# Patient Record
Sex: Female | Born: 1960 | Race: Black or African American | Hispanic: No | Marital: Single | Smoking: Never smoker
Health system: Southern US, Community
[De-identification: ages and names within clinical notes are randomized; demographics above are authoritative.]

## PROBLEM LIST (undated history)

## (undated) DIAGNOSIS — F329 Major depressive disorder, single episode, unspecified: Secondary | ICD-10-CM

## (undated) DIAGNOSIS — F32A Depression, unspecified: Secondary | ICD-10-CM

## (undated) DIAGNOSIS — M797 Fibromyalgia: Secondary | ICD-10-CM

## (undated) DIAGNOSIS — G4700 Insomnia, unspecified: Secondary | ICD-10-CM

## (undated) DIAGNOSIS — K2970 Gastritis, unspecified, without bleeding: Secondary | ICD-10-CM

## (undated) HISTORY — PX: CERVICAL SPINE SURGERY: SHX589

## (undated) HISTORY — PX: BACK SURGERY: SHX140

## (undated) HISTORY — PX: ABDOMINAL HYSTERECTOMY: SHX81

---

## 2012-12-05 ENCOUNTER — Emergency Department (HOSPITAL_COMMUNITY): Payer: Medicare Other

## 2012-12-05 ENCOUNTER — Encounter (HOSPITAL_COMMUNITY): Payer: Self-pay | Admitting: *Deleted

## 2012-12-05 ENCOUNTER — Inpatient Hospital Stay (HOSPITAL_COMMUNITY)
Admission: EM | Admit: 2012-12-05 | Discharge: 2012-12-07 | DRG: 101 | Disposition: A | Discharge status: Home / Self Care | Payer: Medicare Other | Attending: Family Medicine | Admitting: Family Medicine

## 2012-12-05 DIAGNOSIS — F329 Major depressive disorder, single episode, unspecified: Secondary | ICD-10-CM | POA: Diagnosis present

## 2012-12-05 DIAGNOSIS — R51 Headache: Secondary | ICD-10-CM | POA: Diagnosis present

## 2012-12-05 DIAGNOSIS — N189 Chronic kidney disease, unspecified: Secondary | ICD-10-CM | POA: Diagnosis present

## 2012-12-05 DIAGNOSIS — R519 Headache, unspecified: Secondary | ICD-10-CM

## 2012-12-05 DIAGNOSIS — F3289 Other specified depressive episodes: Secondary | ICD-10-CM | POA: Diagnosis present

## 2012-12-05 DIAGNOSIS — G40A01 Absence epileptic syndrome, not intractable, with status epilepticus: Principal | ICD-10-CM | POA: Diagnosis present

## 2012-12-05 DIAGNOSIS — R5381 Other malaise: Secondary | ICD-10-CM | POA: Diagnosis present

## 2012-12-05 DIAGNOSIS — R4182 Altered mental status, unspecified: Secondary | ICD-10-CM | POA: Diagnosis present

## 2012-12-05 LAB — BASIC METABOLIC PANEL
BUN: 11 mg/dL (ref 6–23)
CO2: 27 mEq/L (ref 19–32)
Chloride: 104 mEq/L (ref 96–112)
Creatinine, Ser: 1.37 mg/dL — ABNORMAL HIGH (ref 0.50–1.10)
Glucose, Bld: 107 mg/dL — ABNORMAL HIGH (ref 70–99)
Potassium: 4.1 mEq/L (ref 3.5–5.1)

## 2012-12-05 LAB — CBC WITH DIFFERENTIAL/PLATELET
Basophils Relative: 0 % (ref 0–1)
HCT: 39.4 % (ref 36.0–46.0)
Hemoglobin: 12.9 g/dL (ref 12.0–15.0)
Lymphocytes Relative: 38 % (ref 12–46)
Lymphs Abs: 2 10*3/uL (ref 0.7–4.0)
MCHC: 32.7 g/dL (ref 30.0–36.0)
Monocytes Absolute: 0.3 10*3/uL (ref 0.1–1.0)
Monocytes Relative: 7 % (ref 3–12)
Neutro Abs: 2.9 10*3/uL (ref 1.7–7.7)
RBC: 4.54 MIL/uL (ref 3.87–5.11)

## 2012-12-05 MED ORDER — IBUPROFEN 800 MG PO TABS
800.0000 mg | ORAL_TABLET | Freq: Once | ORAL | Status: AC
  Administered 2012-12-05: 800 mg via ORAL
  Filled 2012-12-05: qty 1

## 2012-12-05 MED ORDER — SODIUM CHLORIDE 0.9 % IJ SOLN
3.0000 mL | Freq: Two times a day (BID) | INTRAMUSCULAR | Status: DC
  Administered 2012-12-06 – 2012-12-07 (×3): 3 mL via INTRAVENOUS

## 2012-12-05 MED ORDER — SODIUM CHLORIDE 0.9 % IV SOLN
INTRAVENOUS | Status: AC
  Administered 2012-12-06: via INTRAVENOUS

## 2012-12-05 MED ORDER — HEPARIN SODIUM (PORCINE) 5000 UNIT/ML IJ SOLN
5000.0000 [IU] | Freq: Three times a day (TID) | INTRAMUSCULAR | Status: DC
  Administered 2012-12-06: 5000 [IU] via SUBCUTANEOUS
  Filled 2012-12-05 (×6): qty 1

## 2012-12-05 MED ORDER — ACETAMINOPHEN 500 MG PO TABS
1000.0000 mg | ORAL_TABLET | Freq: Once | ORAL | Status: AC
  Administered 2012-12-05: 1000 mg via ORAL
  Filled 2012-12-05: qty 2

## 2012-12-05 MED ORDER — TRAMADOL HCL 50 MG PO TABS: 50.0000 mg | ORAL_TABLET | Freq: Four times a day (QID) | ORAL | Status: DC | PRN

## 2012-12-05 MED ORDER — ONDANSETRON 8 MG PO TBDP
8.0000 mg | ORAL_TABLET | Freq: Once | ORAL | Status: AC
  Administered 2012-12-05: 8 mg via ORAL
  Filled 2012-12-05: qty 1

## 2012-12-05 MED ORDER — CLONAZEPAM 0.5 MG PO TABS: 0.5000 mg | ORAL_TABLET | Freq: Three times a day (TID) | ORAL | Status: DC | PRN

## 2012-12-05 MED ORDER — PROMETHAZINE HCL 25 MG PO TABS: 25.0000 mg | ORAL_TABLET | Freq: Four times a day (QID) | ORAL | Status: DC | PRN

## 2012-12-05 MED ORDER — VITAMIN B-12 1000 MCG PO TABS
1000.0000 ug | ORAL_TABLET | Freq: Every day | ORAL | Status: DC
  Administered 2012-12-06 – 2012-12-07 (×2): 1000 ug via ORAL
  Filled 2012-12-05 (×2): qty 1

## 2012-12-05 MED ORDER — DULOXETINE HCL 30 MG PO CPEP
30.0000 mg | ORAL_CAPSULE | Freq: Every day | ORAL | Status: DC
  Administered 2012-12-06 – 2012-12-07 (×2): 30 mg via ORAL
  Filled 2012-12-05 (×2): qty 1

## 2012-12-05 MED ORDER — BUPROPION HCL ER (XL) 300 MG PO TB24
300.0000 mg | ORAL_TABLET | Freq: Every day | ORAL | Status: DC
  Administered 2012-12-06 – 2012-12-07 (×2): 300 mg via ORAL
  Filled 2012-12-05 (×2): qty 1

## 2012-12-05 NOTE — Consult Note (Signed)
Reason for Consult: Recurrent spells of transient inattentiveness.  HPI:                                                                                                                                          Jaclyn Gonzales is an 52 y.o. female with a history of depression and anxiety who was transferred from [facility] for evaluation of recurrent spells of inattentiveness and apparent speech difficulty. Symptoms reportedly started yesterday. She's been noted to develop a blank stare and become unresponsive to those around her. She is lucid immediately at the end of the spells. No focal seizure activity has been described. Patient apparently is unaware that she is not responding. CT scan of her head showed no acute intracranial abnormality. She's on Wellbutrin as well as Cymbalta for depression. She indicated she has been taking Wellbutrin for several years. MRI of the brain is pending. EEG is also been ordered. There is no documented history of seizure activity  History reviewed. No pertinent past medical history.  Past Surgical History  Procedure Laterality Date  . Abdominal hysterectomy      History reviewed. No pertinent family history.  Social History:  reports that she has never smoked. She does not have any smokeless tobacco history on file. She reports that she does not drink alcohol or use illicit drugs.  Allergies  Allergen Reactions  . Other     Hard to wake up from anesthesia,     MEDICATIONS:                                                                                                                     I have reviewed the patient's current medications.   ROS:  History obtained from the patient  General ROS: negative for - chills, fatigue, fever, night sweats, weight gain or weight loss Psychological ROS: negative for - behavioral disorder,  hallucinations, memory difficulties, mood swings or suicidal ideation Ophthalmic ROS: negative for - blurry vision, double vision, eye pain or loss of vision ENT ROS: negative for - epistaxis, nasal discharge, oral lesions, sore throat, tinnitus or vertigo Allergy and Immunology ROS: negative for - hives or itchy/watery eyes Hematological and Lymphatic ROS: negative for - bleeding problems, bruising or swollen lymph nodes Endocrine ROS: negative for - galactorrhea, hair pattern changes, polydipsia/polyuria or temperature intolerance Respiratory ROS: negative for - cough, hemoptysis, shortness of breath or wheezing Cardiovascular ROS: negative for - chest pain, dyspnea on exertion, edema or irregular heartbeat Gastrointestinal ROS: negative for - abdominal pain, diarrhea, hematemesis, nausea/vomiting or stool incontinence Genito-Urinary ROS: negative for - dysuria, hematuria, incontinence or urinary frequency/urgency Musculoskeletal ROS: negative for - joint swelling or muscular weakness Neurological ROS: as noted in HPI Dermatological ROS: negative for rash and skin lesion changes   Blood pressure 119/69, pulse 79, temperature 98.3 F (36.8 C), temperature source Oral, resp. rate 16, height 5' 6.5" (1.689 m), weight 61.236 kg (135 lb), SpO2 100.00%.   Neurologic Examination:                                                                                                      Mental Status: Alert, oriented x3 and in no distress.  Speech fluent without evidence of aphasia. Able to follow commands without difficulty. Patient had frequent episodes of developing a blank stare and not responding to verbal stimulation nor to tactile stimulation. Spells typically lasted 20-30 seconds in resolved with patient being immediately lucid again. Spells were occurring with periods of lucidity less than a minute between spells. It was noted that the patient was able to visually track my movements around the room  during the spells. No automatisms were noted. Cranial Nerves: II-Visual fields were normal. III/IV/VI-Pupils were equal and reacted. Extraocular movements were full and conjugate.    V/VII-no facial numbness and no facial weakness. VIII-normal. X-normal speech. Motor: 5/5 bilaterally with normal tone and bulk Sensory: Normal throughout. Deep Tendon Reflexes: 2+ and symmetric. Plantars: Flexor bilaterally Cerebellar: Normal finger-to-nose testing.  No results found for this basename: cbc, bmp, coags, chol, tri, ldl, hga1c    Results for orders placed during the hospital encounter of 12/05/12 (from the past 48 hour(s))  CBC WITH DIFFERENTIAL     Status: None   Collection Time    12/05/12 12:26 PM      Result Value Range   WBC 5.2  4.0 - 10.5 K/uL   RBC 4.54  3.87 - 5.11 MIL/uL   Hemoglobin 12.9  12.0 - 15.0 g/dL   HCT 16.1  09.6 - 04.5 %   MCV 86.8  78.0 - 100.0 fL   MCH 28.4  26.0 - 34.0 pg   MCHC 32.7  30.0 - 36.0 g/dL   RDW 40.9  81.1 - 91.4 %   Platelets 270  150 - 400  K/uL   Neutrophils Relative % 55  43 - 77 %   Neutro Abs 2.9  1.7 - 7.7 K/uL   Lymphocytes Relative 38  12 - 46 %   Lymphs Abs 2.0  0.7 - 4.0 K/uL   Monocytes Relative 7  3 - 12 %   Monocytes Absolute 0.3  0.1 - 1.0 K/uL   Eosinophils Relative 1  0 - 5 %   Eosinophils Absolute 0.0  0.0 - 0.7 K/uL   Basophils Relative 0  0 - 1 %   Basophils Absolute 0.0  0.0 - 0.1 K/uL  BASIC METABOLIC PANEL     Status: Abnormal   Collection Time    12/05/12 12:26 PM      Result Value Range   Sodium 140  135 - 145 mEq/L   Potassium 4.1  3.5 - 5.1 mEq/L   Chloride 104  96 - 112 mEq/L   CO2 27  19 - 32 mEq/L   Glucose, Bld 107 (*) 70 - 99 mg/dL   BUN 11  6 - 23 mg/dL   Creatinine, Ser 1.91 (*) 0.50 - 1.10 mg/dL   Calcium 9.9  8.4 - 47.8 mg/dL   GFR calc non Af Amer 44 (*) >90 mL/min   GFR calc Af Amer 52 (*) >90 mL/min   Comment: (NOTE)     The eGFR has been calculated using the CKD EPI equation.     This  calculation has not been validated in all clinical situations.     eGFR's persistently <90 mL/min signify possible Chronic Kidney     Disease.    Ct Head Wo Contrast  12/05/2012   *RADIOLOGY REPORT*  Clinical Data: Slurred speech and left sided headache.  CT HEAD WITHOUT CONTRAST  Technique:  Contiguous axial images were obtained from the base of the skull through the vertex without contrast.  Comparison: 04/18/2009 from [facility]  Findings: Bone windows demonstrate minimal right-sided mastoid fluid.  Soft tissue windows demonstrate mild motion degradation.  Given this factor, no  mass lesion, hemorrhage, hydrocephalus, acute infarct, intra-axial, or extra-axial fluid collection.  IMPRESSION:  1.  Mildly motion degraded exam.  Given this factor, no acute intracranial findings. 2.  Small right-sided mastoid effusion.   Original Report Authenticated By: Jeronimo Greaves, M.D.    Assessment/Plan: Recurrent spells of inattentiveness with blank stare and unresponsiveness of unclear etiology. It is unclear if these spells are manifestations of an epileptic disorder or possibly psychogenic.  I agree with obtaining MRI study of the brain without and with contrast media, as well as EEG.  I will defer treatment intervention at this point and await results of the above studies.  We'll continue to follow this patient with you.  C.R. Roseanne Reno, MD [facility] [phone]  12/05/2012, 11:47 PM

## 2012-12-05 NOTE — ED Provider Notes (Signed)
CSN: [id number]     Arrival date & time 12/05/12  1057 History  This chart was scribed for Donnetta Hutching, MD by Quintella Reichert, ED scribe.  This patient was seen in room APA10/[facility]10 and the patient's care was started at 12:14 PM.    Chief Complaint  Patient presents with  . Headache    The history is provided by the patient and a relative. No language interpreter was used.   Level 5 Caveat: Difficult to Understand Speech  HPI Comments: Asiana Benninger is a 52 y.o. female with no pertinent medical history who presents to the Emergency Department complaining of sudden-onset speech difficulty that began 4 hours ago with associated headache and weakness.  Pt reports that she woke up yesterday morning with a severe left-sided headache and later that day developed facial numbness and neck pain.  This morning her headache moved to the right side and she developed weakness and fell.  She denies head impact or LOC.  4 hours ago family reports that pt suddenly developed stuttering speech.  They note that at baseline her speech is normal.  Pt states she still has a headache.  She has not taken any pain medications pta.  Pt reports a history of chronic kidney disease.  She denies h/o HTN, DM or any other chronic medical conditions.   History reviewed. No pertinent past medical history.   Past Surgical History  Procedure Laterality Date  . Abdominal hysterectomy      No family history on file.   History  Substance Use Topics  . Smoking status: Never Smoker   . Smokeless tobacco: Not on file  . Alcohol Use: No    OB History   Grav Para Term Preterm Abortions TAB SAB Ect Mult Living                  Review of Systems A complete 10 system review of systems was obtained and all systems are negative except as noted in the HPI and PMH.    Allergies  Other  Home Medications  No current outpatient prescriptions on file.  BP 142/83  Pulse 82  Temp(Src) 98.5 F (36.9 C) (Oral)  Resp  20  Ht 5' 6.5" (1.689 m)  Wt 135 lb (61.236 kg)  BMI 21.47 kg/m2  SpO2 98%  Physical Exam  Nursing note and vitals reviewed. Constitutional: She is oriented to person, place, and time. She appears well-developed and well-nourished.  Speech stuttering  HENT:  Head: Normocephalic and atraumatic.  Eyes: Conjunctivae and EOM are normal. Pupils are equal, round, and reactive to light.  Neck: Normal range of motion. Neck supple.  Cardiovascular: Normal rate, regular rhythm and normal heart sounds.   Pulmonary/Chest: Effort normal and breath sounds normal.  Abdominal: Soft. Bowel sounds are normal.  Musculoskeletal: Normal range of motion.  Neurological: She is alert and oriented to person, place, and time.  Skin: Skin is warm and dry.  Psychiatric: She has a normal mood and affect.    ED Course  Procedures (including critical care time)  DIAGNOSTIC STUDIES: Oxygen Saturation is 98% on room air, normal by my interpretation.    COORDINATION OF CARE: 12:16 PM-Discussed treatment plan which includes head CT with pt at bedside and pt agreed to plan.    Results for orders placed during the hospital encounter of 12/05/12  CBC WITH DIFFERENTIAL      Result Value Range   WBC 5.2  4.0 - 10.5 K/uL   RBC 4.54  3.87 -  5.11 MIL/uL   Hemoglobin 12.9  12.0 - 15.0 g/dL   HCT 95.6  21.3 - 08.6 %   MCV 86.8  78.0 - 100.0 fL   MCH 28.4  26.0 - 34.0 pg   MCHC 32.7  30.0 - 36.0 g/dL   RDW 57.8  46.9 - 62.9 %   Platelets 270  150 - 400 K/uL   Neutrophils Relative % 55  43 - 77 %   Neutro Abs 2.9  1.7 - 7.7 K/uL   Lymphocytes Relative 38  12 - 46 %   Lymphs Abs 2.0  0.7 - 4.0 K/uL   Monocytes Relative 7  3 - 12 %   Monocytes Absolute 0.3  0.1 - 1.0 K/uL   Eosinophils Relative 1  0 - 5 %   Eosinophils Absolute 0.0  0.0 - 0.7 K/uL   Basophils Relative 0  0 - 1 %   Basophils Absolute 0.0  0.0 - 0.1 K/uL  BASIC METABOLIC PANEL      Result Value Range   Sodium 140  135 - 145 mEq/L   Potassium  4.1  3.5 - 5.1 mEq/L   Chloride 104  96 - 112 mEq/L   CO2 27  19 - 32 mEq/L   Glucose, Bld 107 (*) 70 - 99 mg/dL   BUN 11  6 - 23 mg/dL   Creatinine, Ser 5.28 (*) 0.50 - 1.10 mg/dL   Calcium 9.9  8.4 - 41.3 mg/dL   GFR calc non Af Amer 44 (*) >90 mL/min   GFR calc Af Amer 52 (*) >90 mL/min    Ct Head Wo Contrast  12/05/2012   *RADIOLOGY REPORT*  Clinical Data: Slurred speech and left sided headache.  CT HEAD WITHOUT CONTRAST  Technique:  Contiguous axial images were obtained from the base of the skull through the vertex without contrast.  Comparison: 04/18/2009 from [facility]  Findings: Bone windows demonstrate minimal right-sided mastoid fluid.  Soft tissue windows demonstrate mild motion degradation.  Given this factor, no  mass lesion, hemorrhage, hydrocephalus, acute infarct, intra-axial, or extra-axial fluid collection.  IMPRESSION:  1.  Mildly motion degraded exam.  Given this factor, no acute intracranial findings. 2.  Small right-sided mastoid effusion.   Original Report Authenticated By: Jeronimo Greaves, M.D.     MDM  No diagnosis found. Uncertain etiology of patient's presentation. CT scan normal. No neurological deficits at discharge.     I personally performed the services described in this documentation, which was scribed in my presence. The recorded information has been reviewed and is accurate.    Donnetta Hutching, MD 12/05/12 [phone]

## 2012-12-05 NOTE — ED Notes (Signed)
Pt c/o left side headache, neck pain, facial numbness  that was yesterday, right side headache, weakness all over causing her to fall this am, denies hitting her head.  that started this am, pt and family noticed that pt's speech was different around 8:00 today, pt reports that she was stung by a bee also on right wrist a few days ago, admits to nausea,

## 2012-12-05 NOTE — ED Notes (Signed)
Family member called RN to room, states pt " goes into blank stare, like someone flipped a switch for about 10-15 seconds, then she comes back and talks". Pt's son states he has seen this before. Said behavior not witnessed by RN at this time.  EDP notified.

## 2012-12-05 NOTE — ED Notes (Signed)
Called [facility] to give bed assignment.

## 2012-12-05 NOTE — ED Notes (Signed)
EDP at bedside attempting to obtain a more precise history of Pt. Pt and family member state " has spells where she can walk just fine and other times she walks with a cane".  Pt refers to These as spells, but never diagnosed with " anything". Attempted to ambulate pt, Pt out of bed, and " knees buckled" under her,  Pt able to steady self with bed, Pt then attempted to take a step and " knees again buckled".  EDP at bedside witnessed this. Pt returned to bed at this time.

## 2012-12-05 NOTE — ED Notes (Signed)
Pt unable to ambulate to restroom, unsteady gait. Bedside commode provided. 1 assist needed

## 2012-12-05 NOTE — ED Provider Notes (Signed)
Jaclyn Gonzales soon from Dr. Adriana Simas at shift change. Patient is awaiting a call back from the hospitalist at [facility] as the hospitalist here does not feel as though she can receive appropriate care here. Patient had weakness in her legs and speech difficulty off and on throughout the day and the decision was made that she needs an MRI and neurology consultation, both of which we cannot obtain at [facility]. I have spoken with Dr. David Stall at [facility] who agrees to accept the patient in transfer. I have reevaluated the patient she appears neurologically intact at this time. The admitting doctor at [facility] has recommended an EKG be performed and this was done.   Date: 12/05/2012  Rate: 77  Rhythm: normal sinus rhythm  QRS Axis: normal  Intervals: normal  ST/T Wave abnormalities: normal  Conduction Disutrbances:none  Narrative Interpretation:   Old EKG Reviewed: none available    Geoffery Lyons, MD 12/05/12 1723

## 2012-12-05 NOTE — H&P (Signed)
[facility] History and Physical  Jaclyn Gonzales ZOX:[id number] DOB: [birth year] DOA: 12/05/2012  Referring physician: ED PCP: No primary provider on file.   Chief Complaint: Speech difficulty  HPI: Jaclyn Gonzales is a 52 y.o. female no PMH who presented to the ED at [facility] complaining of sudden onset of speech difficulty that began 4 hours ago with associated headache and generalized weakness.  Patient woke up yesterday morning with severe L sided headache, later that day developed facial numbness and neck pain. This morning her headache moved to the right side and she developed weakness and fell. She denies head impact or LOC. 4 hours ago family reports that pt suddenly developed stuttering speech. They note that at baseline her speech is normal. Pt states she still has a headache. She has not taken any pain medications pta. Pt reports a history of chronic kidney disease. She denies h/o HTN, DM or any other chronic medical conditions.  She was seen at [facility] and transferred to [facility] for MRI and neurology consultation.  Review of Systems: Denies any history of seizures, 12 systems reviewed and otherwise negative.  History reviewed. No pertinent past medical history. Past Surgical History  Procedure Laterality Date  . Abdominal hysterectomy     Social History:  reports that she has never smoked. She does not have any smokeless tobacco history on file. She reports that she does not drink alcohol or use illicit drugs.   Allergies  Allergen Reactions  . Other     Hard to wake up from anesthesia,     History reviewed. No pertinent family history.  Prior to Admission medications   Medication Sig Start Date End Date Taking? Authorizing Provider  buPROPion (WELLBUTRIN XL) 300 MG 24 hr tablet Take 1 tablet by mouth daily. 11/14/12  Yes Historical Provider, MD  clonazePAM (KLONOPIN) 0.5 MG tablet Take 1 tablet by mouth 3 (three) times daily as needed. 11/14/12  Yes Historical Provider, MD   DULoxetine (CYMBALTA) 30 MG capsule Take 1 capsule by mouth daily. 11/14/12  Yes Historical Provider, MD  vitamin B-12 (CYANOCOBALAMIN) 1000 MCG tablet Take 1,000 mcg by mouth daily.   Yes Historical Provider, MD  promethazine (PHENERGAN) 25 MG tablet Take 1 tablet (25 mg total) by mouth every 6 (six) hours as needed for nausea. 12/05/12   Donnetta Hutching, MD  traMADol (ULTRAM) 50 MG tablet Take 1 tablet (50 mg total) by mouth every 6 (six) hours as needed for pain. 12/05/12   Donnetta Hutching, MD   Physical Exam: Filed Vitals:   12/05/12 1925  BP: 104/66  Pulse: 87  Temp: 98.1 F (36.7 C)  Resp: 20    General:  NAD, resting comfortably in bed Eyes: PEERLA EOMI ENT: mucous membranes moist Neck: supple w/o JVD Cardiovascular: RRR w/o MRG Respiratory: CTA B Abdomen: soft, nt, nd, bs+ Skin: no rash nor lesion Musculoskeletal: MAE, full ROM all 4 extremities Psychiatric: normal tone and affect Neurologic: AAOx3, the patient is noted to have 6 to 7 episodes of 15-30 seconds of blank staring spells c/w petite mal seizure.  She is unresponsive to noxious and painful stimuli during these spells.  She does not seem to be aware she is having these spells.  Labs on Admission:  Basic Metabolic Panel:  Recent Labs Lab 12/05/12 1226  NA 140  K 4.1  CL 104  CO2 27  GLUCOSE 107*  BUN 11  CREATININE 1.37*  CALCIUM 9.9   Liver Function Tests: No results found for this basename: AST,  ALT, ALKPHOS, BILITOT, PROT, ALBUMIN,  in the last 168 hours No results found for this basename: LIPASE, AMYLASE,  in the last 168 hours No results found for this basename: AMMONIA,  in the last 168 hours CBC:  Recent Labs Lab 12/05/12 1226  WBC 5.2  NEUTROABS 2.9  HGB 12.9  HCT 39.4  MCV 86.8  PLT 270   Cardiac Enzymes: No results found for this basename: CKTOTAL, CKMB, CKMBINDEX, TROPONINI,  in the last 168 hours  BNP (last 3 results) No results found for this basename: PROBNP,  in the last 8760  hours CBG: No results found for this basename: GLUCAP,  in the last 168 hours  Radiological Exams on Admission: Ct Head Wo Contrast  12/05/2012   *RADIOLOGY REPORT*  Clinical Data: Slurred speech and left sided headache.  CT HEAD WITHOUT CONTRAST  Technique:  Contiguous axial images were obtained from the base of the skull through the vertex without contrast.  Comparison: 04/18/2009 [facility]  Findings: Bone windows demonstrate minimal right-sided mastoid fluid.  Soft tissue windows demonstrate mild motion degradation.  Given this factor, no  mass lesion, hemorrhage, hydrocephalus, acute infarct, intra-axial, or extra-axial fluid collection.  IMPRESSION:  1.  Mildly motion degraded exam.  Given this factor, no acute intracranial findings. 2.  Small right-sided mastoid effusion.   Original Report Authenticated By: Jeronimo Greaves, M.D.    EKG: Independently reviewed.  Assessment/Plan Principal Problem:   Petit mal status  1.  Petit mal seizures - occuring recurrently, 6 or 7 episodes while I am evaluating the patient in the room.  See physical exam above.  Have consulted neurology, spoke with Dr. Roseanne Reno who will evaluate the patient and make recommendations.  Also ordered MRI brain and EEG for further evaluation.    Code Status: Full Code (must indicate code status--if unknown or must be presumed, indicate so) Family Communication: No family in room (indicate person spoken with, if applicable, with phone number if by telephone) Disposition Plan: Admit to inpatient (indicate anticipated LOS)  Time spent: 50 min  Jaclyn Gonzales M. [facility] Pager [phone]  If 7PM-7AM, please contact night-coverage [email or web address] Password [facility] 12/05/2012, 10:33 PM

## 2012-12-06 ENCOUNTER — Inpatient Hospital Stay (HOSPITAL_COMMUNITY): Payer: Medicare Other

## 2012-12-06 DIAGNOSIS — R51 Headache: Secondary | ICD-10-CM

## 2012-12-06 NOTE — Progress Notes (Signed)
Pt has episodes of blank stares and pauses in the middle of speaking, with rapid eye blinking during these times. These episodes last about 5-10 seconds and pt will continue speaking after they are complete. Pt seems unaware that they are occuring. RN witnessed and will continue to monitor.

## 2012-12-06 NOTE — Progress Notes (Signed)
Kobe Jansma BJY:[id number] DOB: [birth year] DOA: 12/05/2012 PCP: No primary provider on file.  Brief narrative: 52 YO ? admitted 9/6 with sudden onset debility speaking headache and generalized weakness, left-sided headache with facial numbness neck pain She actually states that she has had these episodes in church about a year ago and has had episodes where she has weakness on one side of body and then sitting weakness onto the other side with visual changes-she does not think that she's had a seizure but still has the weakness that she had yesterday which came in with but when I did examine her and ask her to raise her lower extremity she grips her teddy bear with her left hand with a much tighter biceps drip than what she demonstrated to me earlier  Past medical history-As per Problem list Chart reviewed as below- nad  Consultants:  neurology  Procedures:  MRI pending  Eeg pending  Antibiotics:  None    Subjective  Well. Some persitsing weakness, but not reproducible   Objective    Interim History:   Telemetry: NSR  Objective: Filed Vitals:   12/05/12 1925 12/05/12 2000 12/06/12 0200 12/06/12 0600  BP: 104/66 119/69 127/82 114/80  Pulse: 87 79 80 54  Temp: 98.1 F (36.7 C) 98.3 F (36.8 C) 98 F (36.7 C) 97.7 F (36.5 C)  TempSrc: Oral     Resp: 20 16 16 16   Height:      Weight:      SpO2: 99% 100% 100% 98%   No intake or output data in the 24 hours ending 12/06/12 1229  Exam:  General: EOMI Cardiovascular:  s1 s2 no m/r/g Respiratory: cta b Abdomen: soft nt nd Skin no le edema Neuro inconsistent weakness noted.  Sensory intact  Data Reviewed: Basic Metabolic Panel:  Recent Labs Lab 12/05/12 1226  NA 140  K 4.1  CL 104  CO2 27  GLUCOSE 107*  BUN 11  CREATININE 1.37*  CALCIUM 9.9   Liver Function Tests: No results found for this basename: AST, ALT, ALKPHOS, BILITOT, PROT, ALBUMIN,  in the last 168 hours No results found for  this basename: LIPASE, AMYLASE,  in the last 168 hours No results found for this basename: AMMONIA,  in the last 168 hours CBC:  Recent Labs Lab 12/05/12 1226  WBC 5.2  NEUTROABS 2.9  HGB 12.9  HCT 39.4  MCV 86.8  PLT 270   Cardiac Enzymes: No results found for this basename: CKTOTAL, CKMB, CKMBINDEX, TROPONINI,  in the last 168 hours BNP: No components found with this basename: POCBNP,  CBG: No results found for this basename: GLUCAP,  in the last 168 hours  No results found for this or any previous visit (from the past 240 hour(s)).   Studies:              All Imaging reviewed and is as per above notation   Scheduled Meds: . buPROPion  300 mg Oral Daily  . DULoxetine  30 mg Oral Daily  . heparin  5,000 Units Subcutaneous Q8H  . sodium chloride  3 mL Intravenous Q12H  . vitamin B-12  1,000 mcg Oral Daily   Continuous Infusions:    Assessment/Plan: 1. ? Psychogenic Sz-Await work-up with EEG and MRI.  Appreciate Neuro input 2. Likely bipolar-continue Cymbalta 30, Clonopin 0.5 tid, Bupropion Xl 300.  noted that some of these meds can lower Sz threshold  Code Status: Full Family Communication:  None at bedside Disposition Plan:  inpatient  Pleas Koch, MD  [facility] Pager [phone] 12/06/2012, 12:29 PM    LOS: 1 day

## 2012-12-06 NOTE — Progress Notes (Signed)
Subjective: Continues to have spells once to twice per hour.  Exam: Filed Vitals:   12/06/12 0600  BP: 114/80  Pulse: 54  Temp: 97.7 F (36.5 C)  Resp: 16   Gen: In bed, NAD MS: Awake, alert, oriented CN: Pupils equal round and reactive, extra to movements intact, visual fields full Motor: 5/5 throughout Sensory: Intact to light touch  I did not observe any staring spells   Impression: 52 year old female with new onset staring spells with resumption of normal speech at the cessation of the spell. This is a atypical for complex partial seizures, and absence epilepsy does not present at age 43.    Recommendations: 1) followup ordered MRI 2) EEG 3) would not advance treatment and let she had prolonged (greater than 5 minutes) periods of unresponsiveness.  Ritta Slot, MD [facility] [phone]  If 7pm- 7am, please page neurology on call at [phone].

## 2012-12-07 ENCOUNTER — Inpatient Hospital Stay (HOSPITAL_COMMUNITY): Payer: Medicare Other

## 2012-12-07 MED ORDER — ACETAMINOPHEN 325 MG PO TABS
650.0000 mg | ORAL_TABLET | Freq: Four times a day (QID) | ORAL | Status: DC | PRN
  Administered 2012-12-07: 650 mg via ORAL
  Filled 2012-12-07: qty 2

## 2012-12-07 NOTE — Discharge Summary (Signed)
Physician Discharge Summary  Jaclyn Gonzales GNF:[id number] DOB: [birth year] DOA: 12/05/2012  PCP: No primary provider on file.  Admit date: 12/05/2012 Discharge date: 12/07/2012  Time spent: 20   Recommendations for Outpatient Follow-up:  1. recommend CBT as OP with psyhcology/psychiatry 2. Follow with PCP as an op 3. continue PTA meds  Discharge Diagnoses:  Principal Problem:   Petit mal status Active Problems:   Altered mental status   Discharge Condition: good  Diet recommendation: regular  Filed Weights   12/05/12 1137  Weight: 61.236 kg (135 lb)    History of present illness:  52 YO ? admitted 9/6 with sudden onset debility speaking headache and generalized weakness, left-sided headache with facial numbness neck pain  She actually states that she has had these episodes in church about 50 years ago and has had episodes where she has weakness on one side of body and then sitting weakness onto the other side with visual changes-she does not think that she's had a seizure but still has the weakness that she had yesterday which came in with but when I did examine her and ask her to raise her lower extremity she grips her teddy bear with her left hand with a much tighter biceps drip than what she demonstrated to me earlier   Hospital Course:  1. ? 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  Meets with therapist-will need close follow-up. Appreciate Neuro input and discussion with patient about diagnosis 2. Likely bipolar-continue Cymbalta 30, Clonopin 0.5 tid, Bupropion Xl 300. noted that some of these meds can lower Sz threshold   Procedures:  EEG done 12/07/12 was neg for Seizure and consitent with psychogenic spells   Consultations:  Neurology  Discharge Exam: Filed Vitals:   12/07/12 1010  BP: 132/75  Pulse: 83  Temp: 98.3 F (36.8 C)  Resp: 16   Alert quiet, son in room  General: EOMI, NCAt Cardiovascular:  s1 s 2no m/r/g Respiratory:  clear  Discharge  Instructions  Discharge Orders   Future Orders Complete By Expires   Diet - low sodium heart healthy  As directed    Discharge instructions  As directed    Comments:     You were cared for by a hospitalist during your hospital stay. If you have any questions about your discharge medications or the care you received while you were in the hospital after you are discharged, you can call the unit and asked to speak with the hospitalist on call if the hospitalist that took care of you is not available. Once you are discharged, your primary care physician will handle any further medical issues. Please note that NO REFILLS for any discharge medications will be authorized once you are discharged, as it is imperative that you return to your primary care physician (or establish a relationship with a primary care physician if you do not have one) for your aftercare needs so that they can reassess your need for medications and monitor your lab values. If you do not have a primary care physician, you can call [phone] for a physician referral.   Increase activity slowly  As directed        Medication List         buPROPion 300 MG 24 hr tablet  Commonly known as:  WELLBUTRIN XL  Take 1 tablet by mouth daily.     clonazePAM 0.5 MG tablet  Commonly known as:  KLONOPIN  Take 1 tablet by mouth 3 (three) times daily as needed.  DULoxetine 30 MG capsule  Commonly known as:  CYMBALTA  Take 1 capsule by mouth daily.     promethazine 25 MG tablet  Commonly known as:  PHENERGAN  Take 1 tablet (25 mg total) by mouth every 6 (six) hours as needed for nausea.     traMADol 50 MG tablet  Commonly known as:  ULTRAM  Take 1 tablet (50 mg total) by mouth every 6 (six) hours as needed for pain.     vitamin B-12 1000 MCG tablet  Commonly known as:  CYANOCOBALAMIN  Take 1,000 mcg by mouth daily.       Allergies  Allergen Reactions  . Other     Hard to wake up from anesthesia,       The results of  significant diagnostics from this hospitalization (including imaging, microbiology, ancillary and laboratory) are listed below for reference.    Significant Diagnostic Studies: Ct Head Wo Contrast  12/05/2012   *RADIOLOGY REPORT*  Clinical Data: Slurred speech and left sided headache.  CT HEAD WITHOUT CONTRAST  Technique:  Contiguous axial images were obtained from the base of the skull through the vertex without contrast.  Comparison: 04/18/2009 from [facility]  Findings: Bone windows demonstrate minimal right-sided mastoid fluid.  Soft tissue windows demonstrate mild motion degradation.  Given this factor, no  mass lesion, hemorrhage, hydrocephalus, acute infarct, intra-axial, or extra-axial fluid collection.  IMPRESSION:  1.  Mildly motion degraded exam.  Given this factor, no acute intracranial findings. 2.  Small right-sided mastoid effusion.   Original Report Authenticated By: Jeronimo Greaves, M.D.   Mr Brain Wo Contrast  12/06/2012   *RADIOLOGY REPORT*  Clinical Data: Episodes of inattentiveness and speech difficulty.  MRI HEAD WITHOUT CONTRAST  Technique:  Multiplanar, multiecho pulse sequences of the brain and surrounding structures were obtained according to standard protocol without intravenous contrast.  Comparison: 12/05/2012 CT. 06/09/2003 MR.  Findings: No acute infarct.  No intracranial hemorrhage.  No hydrocephalus.  No evidence of mesial temporal sclerosis.  Minimal nonspecific white matter type changes frontal lobes and periventricular region without significant change.  No intracranial mass lesion detected on this unenhanced exam.  Major intracranial vascular structures are patent.  Postsurgical changes mid cervical spine with artifact.  Suggestion of mild spinal stenosis C3-4 and C4 level.  Minimal partial opacification right mastoid air cells and minimal paranasal sinus mucosal thickening.  IMPRESSION: No acute abnormality.  Please see above.   Original Report Authenticated By: Lacy Duverney, M.D.    Microbiology: No results found for this or any previous visit (from the past 240 hour(s)).   Labs: Basic Metabolic Panel:  Recent Labs Lab 12/05/12 1226  NA 140  K 4.1  CL 104  CO2 27  GLUCOSE 107*  BUN 11  CREATININE 1.37*  CALCIUM 9.9   Liver Function Tests: No results found for this basename: AST, ALT, ALKPHOS, BILITOT, PROT, ALBUMIN,  in the last 168 hours No results found for this basename: LIPASE, AMYLASE,  in the last 168 hours No results found for this basename: AMMONIA,  in the last 168 hours CBC:  Recent Labs Lab 12/05/12 1226  WBC 5.2  NEUTROABS 2.9  HGB 12.9  HCT 39.4  MCV 86.8  PLT 270   Cardiac Enzymes: No results found for this basename: CKTOTAL, CKMB, CKMBINDEX, TROPONINI,  in the last 168 hours BNP: BNP (last 3 results) No results found for this basename: PROBNP,  in the last 8760 hours CBG: No results found for this  basename: GLUCAP,  in the last 168 hours     Signed:  Rhetta Mura  [facility] 12/07/2012, 10:31 AM

## 2012-12-07 NOTE — Progress Notes (Signed)
Subjective: Had several episodes on EEG.  Exam: Filed Vitals:   12/07/12 1010  BP: 132/75  Pulse: 83  Temp: 98.3 F (36.8 C)  Resp: 16   Gen: In bed, NAD MS: awake alert, oriented EA:VWUJW, EOMI Motor: 5/5 throughout Sensory:intact to LT  EEG shows no EEG change with episodes.   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 On EEG, there is no change to suggest seizure, and video is suggestive of a psychogenic episode.   Recommendations: 1) Outpatient psychological therapy for conversion.   Ritta Slot, MD [facility] [phone]  If 7pm- 7am, please page neurology on call at [phone].

## 2012-12-07 NOTE — Progress Notes (Signed)
EEG completed; results pending.    

## 2014-10-28 ENCOUNTER — Encounter (HOSPITAL_COMMUNITY): Payer: Self-pay | Admitting: Emergency Medicine

## 2014-10-28 ENCOUNTER — Emergency Department (HOSPITAL_COMMUNITY)
Admission: EM | Admit: 2014-10-28 | Discharge: 2014-10-28 | Disposition: A | Discharge status: Home / Self Care | Payer: Medicare Other | Attending: Emergency Medicine | Admitting: Emergency Medicine

## 2014-10-28 ENCOUNTER — Emergency Department (HOSPITAL_COMMUNITY): Payer: Medicare Other

## 2014-10-28 DIAGNOSIS — Z8719 Personal history of other diseases of the digestive system: Secondary | ICD-10-CM | POA: Diagnosis not present

## 2014-10-28 DIAGNOSIS — F329 Major depressive disorder, single episode, unspecified: Secondary | ICD-10-CM | POA: Insufficient documentation

## 2014-10-28 DIAGNOSIS — R319 Hematuria, unspecified: Secondary | ICD-10-CM | POA: Diagnosis not present

## 2014-10-28 DIAGNOSIS — G4700 Insomnia, unspecified: Secondary | ICD-10-CM | POA: Insufficient documentation

## 2014-10-28 DIAGNOSIS — Z79899 Other long term (current) drug therapy: Secondary | ICD-10-CM | POA: Diagnosis not present

## 2014-10-28 DIAGNOSIS — R1032 Left lower quadrant pain: Secondary | ICD-10-CM | POA: Diagnosis present

## 2014-10-28 DIAGNOSIS — Z90710 Acquired absence of both cervix and uterus: Secondary | ICD-10-CM | POA: Insufficient documentation

## 2014-10-28 DIAGNOSIS — M797 Fibromyalgia: Secondary | ICD-10-CM | POA: Insufficient documentation

## 2014-10-28 DIAGNOSIS — R109 Unspecified abdominal pain: Secondary | ICD-10-CM

## 2014-10-28 HISTORY — DX: Major depressive disorder, single episode, unspecified: F32.9

## 2014-10-28 HISTORY — DX: Gastritis, unspecified, without bleeding: K29.70

## 2014-10-28 HISTORY — DX: Depression, unspecified: F32.A

## 2014-10-28 HISTORY — DX: Insomnia, unspecified: G47.00

## 2014-10-28 HISTORY — DX: Fibromyalgia: M79.7

## 2014-10-28 LAB — CBC WITH DIFFERENTIAL/PLATELET
BASOS ABS: 0 10*3/uL (ref 0.0–0.1)
Basophils Relative: 0 % (ref 0–1)
EOS PCT: 3 % (ref 0–5)
Eosinophils Absolute: 0.1 10*3/uL (ref 0.0–0.7)
HCT: 37.5 % (ref 36.0–46.0)
HEMOGLOBIN: 12.1 g/dL (ref 12.0–15.0)
LYMPHS ABS: 2 10*3/uL (ref 0.7–4.0)
LYMPHS PCT: 46 % (ref 12–46)
MCH: 27.6 pg (ref 26.0–34.0)
MCHC: 32.3 g/dL (ref 30.0–36.0)
MCV: 85.6 fL (ref 78.0–100.0)
Monocytes Absolute: 0.3 10*3/uL (ref 0.1–1.0)
Monocytes Relative: 8 % (ref 3–12)
Neutro Abs: 1.9 10*3/uL (ref 1.7–7.7)
Neutrophils Relative %: 43 % (ref 43–77)
Platelets: 237 10*3/uL (ref 150–400)
RBC: 4.38 MIL/uL (ref 3.87–5.11)
RDW: 14.3 % (ref 11.5–15.5)
WBC: 4.4 10*3/uL (ref 4.0–10.5)

## 2014-10-28 LAB — URINALYSIS, ROUTINE W REFLEX MICROSCOPIC
Bilirubin Urine: NEGATIVE
GLUCOSE, UA: NEGATIVE mg/dL
HGB URINE DIPSTICK: NEGATIVE
KETONES UR: NEGATIVE mg/dL
LEUKOCYTES UA: NEGATIVE
Nitrite: NEGATIVE
PROTEIN: NEGATIVE mg/dL
Urobilinogen, UA: 0.2 mg/dL (ref 0.0–1.0)
pH: 5.5 (ref 5.0–8.0)

## 2014-10-28 LAB — BASIC METABOLIC PANEL
Anion gap: 6 (ref 5–15)
BUN: 13 mg/dL (ref 6–20)
CHLORIDE: 107 mmol/L (ref 101–111)
CO2: 27 mmol/L (ref 22–32)
Calcium: 9 mg/dL (ref 8.9–10.3)
Creatinine, Ser: 1.54 mg/dL — ABNORMAL HIGH (ref 0.44–1.00)
GFR calc Af Amer: 44 mL/min — ABNORMAL LOW (ref >60)
GFR calc non Af Amer: 38 mL/min — ABNORMAL LOW (ref >60)
Glucose, Bld: 104 mg/dL — ABNORMAL HIGH (ref 65–99)
Potassium: 4.2 mmol/L (ref 3.5–5.1)
SODIUM: 140 mmol/L (ref 135–145)

## 2014-10-28 MED ORDER — HYDROCODONE-ACETAMINOPHEN 5-325 MG PO TABS
2.0000 | ORAL_TABLET | Freq: Once | ORAL | Status: AC
  Administered 2014-10-28: 2 via ORAL
  Filled 2014-10-28: qty 2

## 2014-10-28 MED ORDER — HYDROCODONE-ACETAMINOPHEN 5-325 MG PO TABS: 1.0000 | ORAL_TABLET | Freq: Four times a day (QID) | ORAL | Status: AC | PRN

## 2014-10-28 MED ORDER — ONDANSETRON 4 MG PO TBDP
4.0000 mg | ORAL_TABLET | Freq: Once | ORAL | Status: AC
  Administered 2014-10-28: 4 mg via ORAL
  Filled 2014-10-28: qty 1

## 2014-10-28 NOTE — ED Notes (Signed)
PT states she was seen at her primary MD at [facility] yesterday for left flank pain and pt stated she had blood in her urine and pt states she was told to come to ED if worsening to get a CT done. PT states she has a urologist referral for next week.

## 2014-10-28 NOTE — Discharge Instructions (Signed)
Your vital signs are within normal limits. Your urine analysis test and complete blood count are both normal. Your creatinine is elevated, and should be rechecked by your primary doctor. Your CT scan is negative for any stones or any acute changes or findings. Flank Pain Flank pain refers to pain that is located on the side of the body between the upper abdomen and the back. The pain may occur over a short period of time (acute) or may be long-term or reoccurring (chronic). It may be mild or severe. Flank pain can be caused by many things. CAUSES  Some of the more common causes of flank pain include:  Muscle strains.   Muscle spasms.   A disease of your spine (vertebral disk disease).   A lung infection (pneumonia).   Fluid around your lungs (pulmonary edema).   A kidney infection.   Kidney stones.   A very painful skin rash caused by the chickenpox virus (shingles).   Gallbladder disease.  HOME CARE INSTRUCTIONS  Home care will depend on the cause of your pain. In general,  Rest as directed by your caregiver.  Drink enough fluids to keep your urine clear or pale yellow.  Only take over-the-counter or prescription medicines as directed by your caregiver. Some medicines may help relieve the pain.  Tell your caregiver about any changes in your pain.  Follow up with your caregiver as directed. SEEK IMMEDIATE MEDICAL CARE IF:   Your pain is not controlled with medicine.   You have new or worsening symptoms.  Your pain increases.   You have abdominal pain.   You have shortness of breath.   You have persistent nausea or vomiting.   You have swelling in your abdomen.   You feel faint or pass out.   You have blood in your urine.  You have a fever or persistent symptoms for more than 2-3 days.  You have a fever and your symptoms suddenly get worse. MAKE SURE YOU:   Understand these instructions.  Will watch your condition.  Will get help right  away if you are not doing well or get worse. Document Released: 05/09/2005 Document Revised: 12/11/2011 Document Reviewed: 10/31/2011 [facility] Patient Information 2015 [place], [place]. This information is not intended to replace advice given to you by your health care provider. Make sure you discuss any questions you have with your health care provider.

## 2014-10-28 NOTE — ED Provider Notes (Signed)
CSN: [id number]     Arrival date & time 10/28/14  1802 History   First MD Initiated Contact with Patient 10/28/14 1826     Chief Complaint  Patient presents with  . Flank Pain     (Consider location/radiation/quality/duration/timing/severity/associated sxs/prior Treatment) HPI Comments: Patient is a 54 year old female who presents to the emergency department with a complaint of left flank pain and a history of "blood in her urine". The patient states that in May she had flank pain and was noted to have kidney stones on. She states that the pain gradually went away and she has been doing fine until this week when she started having some left flank pain. She was seen by her primary physician at the [facility] on yesterday July 28. Patient states that she was told that she had blood in her urine from a urine analysis. She was also told that the physician wanted her to have a CT scan, but was unable to get the insurance to approve it, and told her that if her pain increase that she should go to the emergency department nearest to her and have a CT scan done to further evaluate her pain. The patient has not had any high fever, she's not had any injury or trauma to the right or left flank areas. She's not had any recent history of strep throat, there has been some urgency, no other urine symptoms reported. The patient has not taken any medication for this issue.  Patient is a 54 y.o. female presenting with flank pain. The history is provided by the patient.  Flank Pain Pertinent negatives include no chills, fever or nausea.    Past Medical History  Diagnosis Date  . Depression   . Gastritis   . Insomnia   . Fibromyalgia    Past Surgical History  Procedure Laterality Date  . Abdominal hysterectomy    . Cesarean section    . Cervical spine surgery    . Back surgery     History reviewed. No pertinent family history. History  Substance Use Topics  . Smoking status: Never  Smoker   . Smokeless tobacco: Not on file  . Alcohol Use: No   OB History    Gravida Para Term Preterm AB TAB SAB Ectopic Multiple Living            2     Review of Systems  Constitutional: Negative for fever, chills and appetite change.  Gastrointestinal: Negative for nausea.  Genitourinary: Positive for flank pain. Negative for difficulty urinating.  All other systems reviewed and are negative.     Allergies  Other  Home Medications   Prior to Admission medications   Medication Sig Start Date End Date Taking? Authorizing Provider  buPROPion (WELLBUTRIN XL) 300 MG 24 hr tablet Take 1 tablet by mouth daily. 11/14/12   Historical Provider, MD  clonazePAM (KLONOPIN) 0.5 MG tablet Take 1 tablet by mouth 3 (three) times daily as needed. 11/14/12   Historical Provider, MD  DULoxetine (CYMBALTA) 30 MG capsule Take 1 capsule by mouth daily. 11/14/12   Historical Provider, MD  vitamin B-12 (CYANOCOBALAMIN) 1000 MCG tablet Take 1,000 mcg by mouth daily.    Historical Provider, MD   BP 113/75 mmHg  Pulse 82  Temp(Src) 98.1 F (36.7 C) (Oral)  Resp 18  Ht 5\' 6"  (1.676 m)  Wt 136 lb (61.689 kg)  BMI 21.96 kg/m2  SpO2 100% Physical Exam  Constitutional: She is oriented to person, place,  and time. She appears well-developed and well-nourished.  Non-toxic appearance.  HENT:  Head: Normocephalic.  Right Ear: Tympanic membrane and external ear normal.  Left Ear: Tympanic membrane and external ear normal.  Eyes: EOM and lids are normal. Pupils are equal, round, and reactive to light.  Neck: Normal range of motion. Neck supple. Carotid bruit is not present.  Cardiovascular: Normal rate, regular rhythm, normal heart sounds, intact distal pulses and normal pulses.   Pulmonary/Chest: Breath sounds normal. No respiratory distress.  Abdominal: Soft. Bowel sounds are normal. There is no tenderness. There is no guarding.  No CVA tenderness noted.  Abdomen is soft with good bowel sounds. There  is some left lower quadrant pain.  Musculoskeletal: Normal range of motion.  Lymphadenopathy:       Head (right side): No submandibular adenopathy present.       Head (left side): No submandibular adenopathy present.    She has no cervical adenopathy.  Neurological: She is alert and oriented to person, place, and time. She has normal strength. No cranial nerve deficit or sensory deficit.  Skin: Skin is warm and dry.  Psychiatric: She has a normal mood and affect. Her speech is normal.  Nursing note and vitals reviewed.   ED Course  Procedures (including critical care time) Labs Review Labs Reviewed  URINALYSIS, ROUTINE W REFLEX MICROSCOPIC (NOT AT [facility])  CBC WITH DIFFERENTIAL/PLATELET  BASIC METABOLIC PANEL    Imaging Review No results found.   EKG Interpretation None      MDM  Vital signs are well within normal limits.  Urinalysis shows the specific gravity to be elevated at greater than 1.030, otherwise the urine is completely normal. The complete blood count is completely normal. The basic metabolic panel shows the creatinine to be elevated at 1.54 it is of note that in 2014 the creatinine was elevated at 1.34. The glomerular filtration rate is low at 44. The anion gap is within normal limits at 6. The CT renal stone study shows no renal stones, no ureteral stones, no hydronephrosis, and no acute findings. There is moderate stool throughout the colon, but no free air, and no acute changes appreciated.  I have discussed the exam findings, as well as the laboratory and CT scan findings with the patient in terms in which she understands. I discussed with her the importance of having her renal function and her flank pain evaluated by her primary doctors at the [facility]. I've asked her to have them to review her labs, as well as her CT scan. She complains that her pain remains at an 8 out of 10. She denies that her primary physician address this on yesterday.  Prescription for 10 tablets of hydrocodone given to the patient. I invited her to return to the emergency department if any emergent changes, problems, or concerns. The patient is in agreement with this discharge plan. She said she was seen by her doctor yesterday    Final diagnoses:  Flank pain    *I have reviewed nursing notes, vital signs, and all appropriate lab and imaging results for this patient.**    Ivery Quale, PA-C 10/28/14 2028  Glynn Octave, MD 10/28/14 [phone]

## 2016-04-01 IMAGING — CT CT RENAL STONE PROTOCOL
2 of 4 series · 16 of 46 positions shown, 18 images · non-contrast
Comparison: None.

CLINICAL DATA: Left flank pain radiating to left groin for 1 day.
Hematuria, nausea.

EXAM:
CT ABDOMEN AND PELVIS WITHOUT CONTRAST
TECHNIQUE: Multidetector CT imaging of the abdomen and pelvis was performed
following the standard protocol without IV contrast.

[Series 2: standard/full over (age)lbs 5.0 · axial · 0.60mm/px · z∈[-256,+138]mm · 13 of 87 slices shown, 15 images]
[im 4/87  soft-tissue]
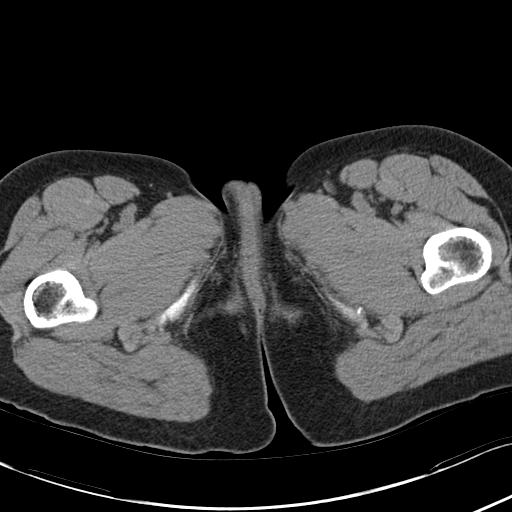
[im 4/87  bone]
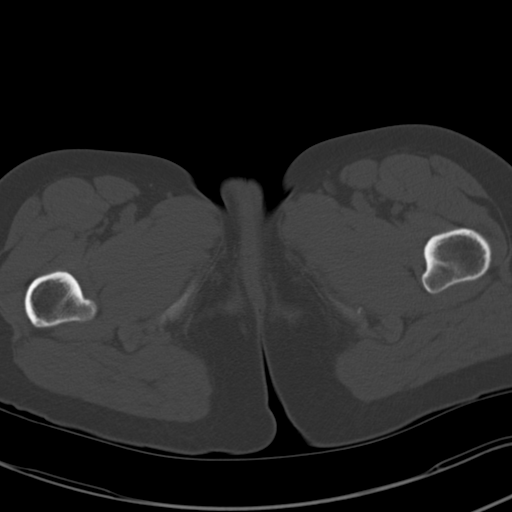
[im 11/87  soft-tissue]
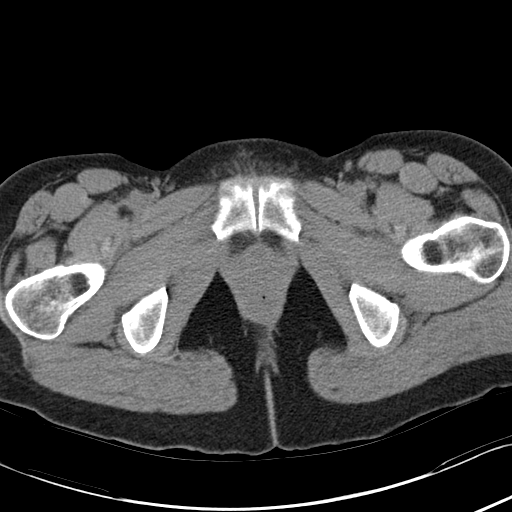
[im 18/87  soft-tissue]
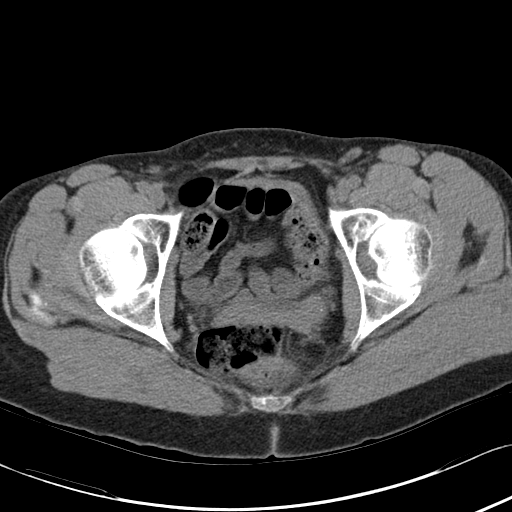
[im 25/87  soft-tissue]
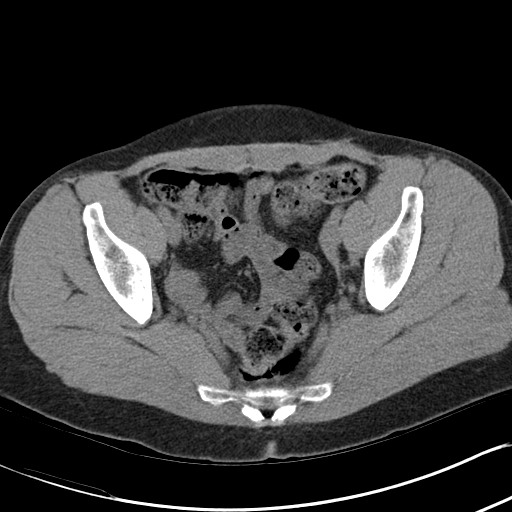
[im 31/87  soft-tissue]
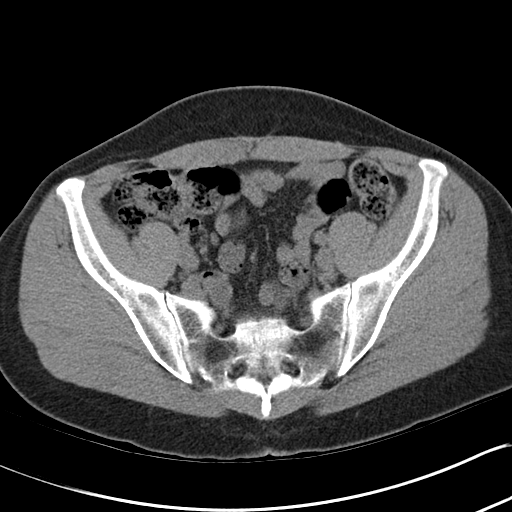
[im 38/87  soft-tissue]
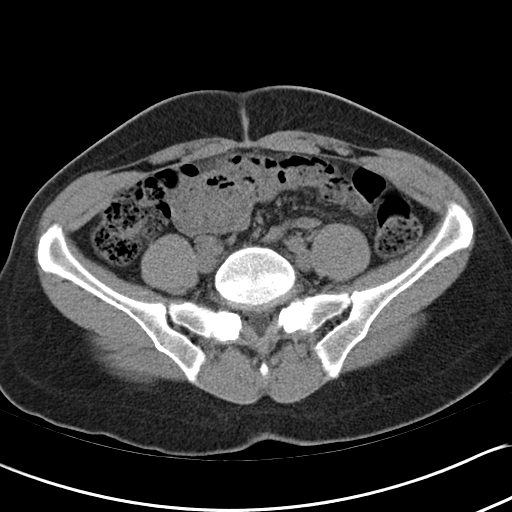
[im 45/87  soft-tissue]
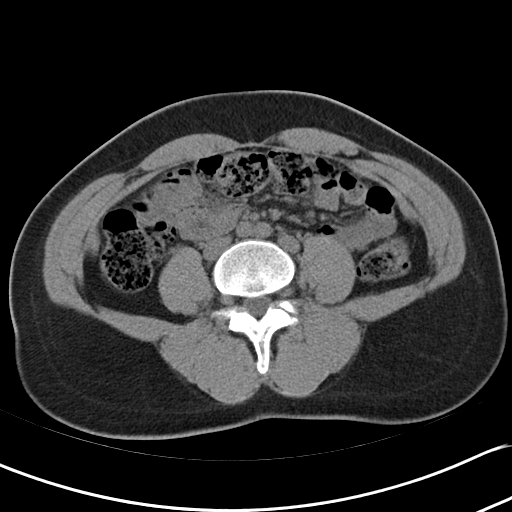
[im 49/87  soft-tissue]
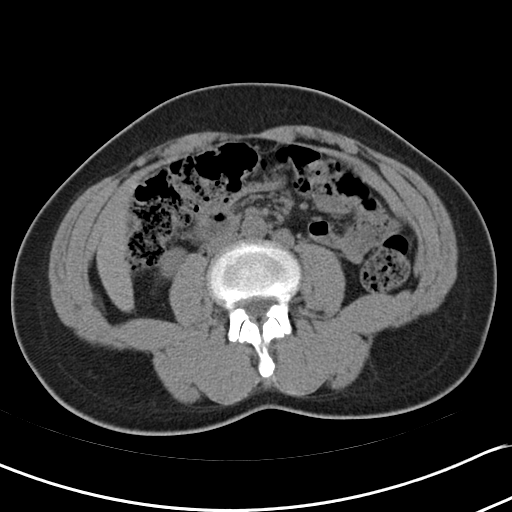
[im 56/87  soft-tissue]
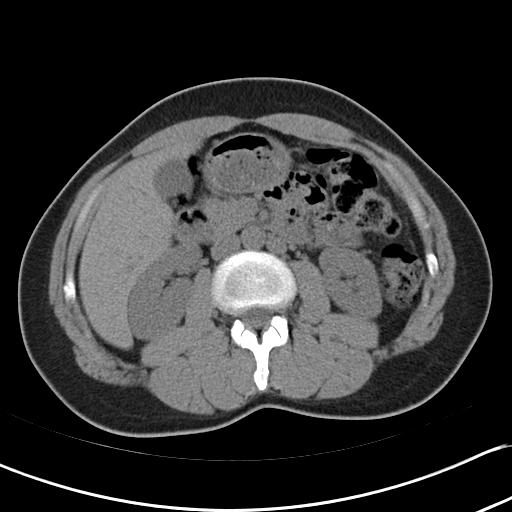
[im 56/87  bone]
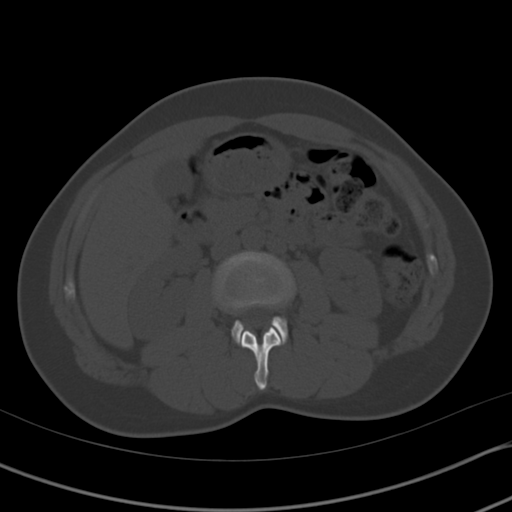
[im 62/87  soft-tissue]
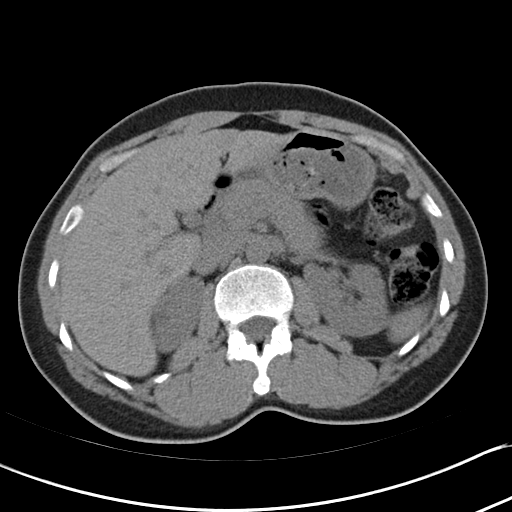
[im 69/87  soft-tissue]
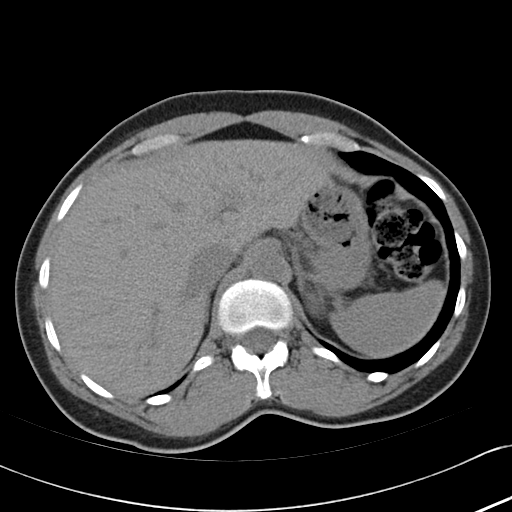
[im 76/87  soft-tissue]
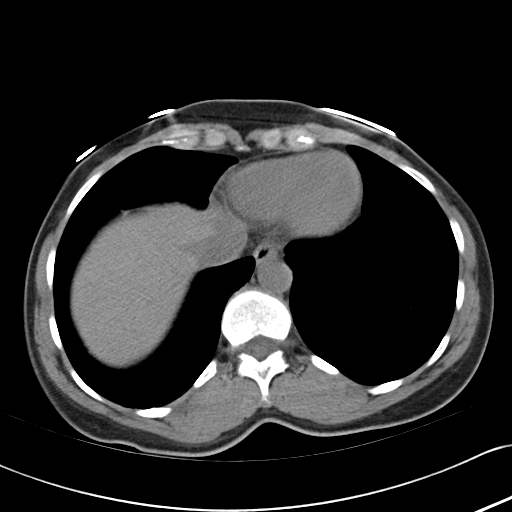
[im 83/87  soft-tissue]
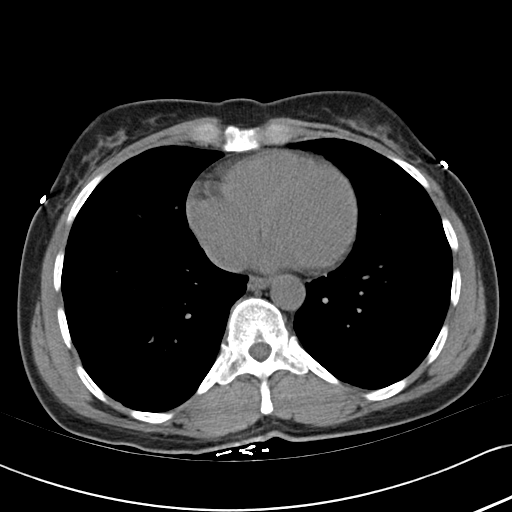

[Series 3: mpr coronal · coronal · 0.62mm/px · 3 of 89 slices shown]
[im 30/89  soft-tissue]
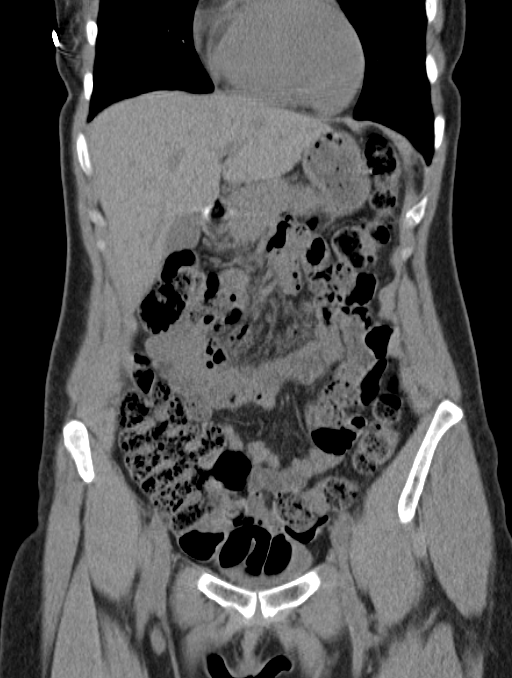
[im 40/89  soft-tissue]
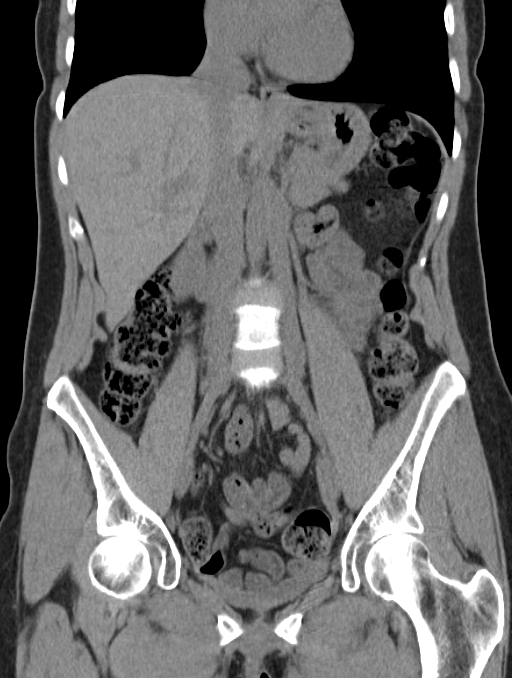
[im 49/89  soft-tissue]
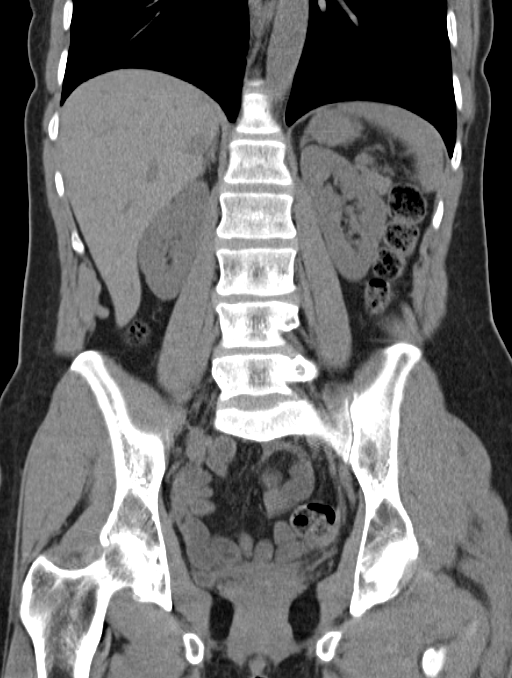

[16 of 46 positions shown; findings below may reference images not displayed]

FINDINGS: Lung bases are clear.  No effusions.  Heart is normal size.

Liver, gallbladder, spleen, pancreas, adrenals and kidneys are
unremarkable on this unenhanced study. No renal or ureteral stones.
No hydronephrosis. Calcifications along the left ureter appear to be
in the gonadal vein external to the ureter.

Prior hysterectomy. No adnexal masses. Urinary bladder is
decompressed.

Moderate stool throughout the colon. Stomach, large and small bowel
are unremarkable. No free fluid, free air or adenopathy. Aorta is
normal caliber.

No acute bony abnormality or focal bone lesion.
IMPRESSION: No renal or ureteral stones.  No hydronephrosis.  No acute findings.

Moderate stool burden.
